# Patient Record
Sex: Female | Born: 1955 | Race: White | Hispanic: No | Marital: Married
Health system: Southern US, Community
[De-identification: ages and names within clinical notes are randomized; demographics above are authoritative.]

---

## 2011-02-02 ENCOUNTER — Inpatient Hospital Stay: Payer: Self-pay | Admitting: Internal Medicine

## 2011-03-30 ENCOUNTER — Emergency Department: Payer: Self-pay | Admitting: Unknown Physician Specialty

## 2011-03-31 LAB — BASIC METABOLIC PANEL
Calcium, Total: 8.5 mg/dL (ref 8.5–10.1)
Chloride: 103 mmol/L (ref 98–107)
Co2: 26 mmol/L (ref 21–32)
Creatinine: 0.63 mg/dL (ref 0.60–1.30)
Potassium: 3.9 mmol/L (ref 3.5–5.1)
Sodium: 142 mmol/L (ref 136–145)

## 2011-03-31 LAB — CBC
MCHC: 33.6 g/dL (ref 32.0–36.0)
RBC: 3.53 10*6/uL — ABNORMAL LOW (ref 3.80–5.20)
RDW: 14.7 % — ABNORMAL HIGH (ref 11.5–14.5)

## 2011-04-18 ENCOUNTER — Ambulatory Visit: Payer: Self-pay | Admitting: Unknown Physician Specialty

## 2011-09-24 ENCOUNTER — Observation Stay: Payer: Self-pay | Admitting: Internal Medicine

## 2011-09-24 LAB — CBC
MCH: 30.7 pg (ref 26.0–34.0)
MCHC: 32.5 g/dL (ref 32.0–36.0)
Platelet: 192 10*3/uL (ref 150–440)

## 2011-09-24 LAB — URINALYSIS, COMPLETE
Blood: NEGATIVE
Nitrite: POSITIVE
Ph: 7 (ref 4.5–8.0)
Protein: NEGATIVE
Specific Gravity: 1.008 (ref 1.003–1.030)
Squamous Epithelial: 1
WBC UR: 2 /HPF (ref 0–5)

## 2011-09-24 LAB — CK TOTAL AND CKMB (NOT AT ARMC)
CK, Total: 42 U/L (ref 21–215)
CK-MB: 0.6 ng/mL (ref 0.5–3.6)

## 2011-09-24 LAB — COMPREHENSIVE METABOLIC PANEL
Albumin: 3.7 g/dL (ref 3.4–5.0)
Alkaline Phosphatase: 142 U/L — ABNORMAL HIGH (ref 50–136)
BUN: 16 mg/dL (ref 7–18)
Bilirubin,Total: 0.3 mg/dL (ref 0.2–1.0)
Co2: 27 mmol/L (ref 21–32)
Creatinine: 0.82 mg/dL (ref 0.60–1.30)
EGFR (Non-African Amer.): >60
Glucose: 190 mg/dL — ABNORMAL HIGH (ref 65–99)
Osmolality: 271 (ref 275–301)
SGPT (ALT): 18 U/L
Sodium: 132 mmol/L — ABNORMAL LOW (ref 136–145)
Total Protein: 7 g/dL (ref 6.4–8.2)

## 2011-09-24 LAB — APTT
Activated PTT: 61.6 secs — ABNORMAL HIGH (ref 23.6–35.9)
Activated PTT: >160 secs (ref 23.6–35.9)

## 2011-09-24 LAB — PROTIME-INR: INR: 0.9

## 2011-09-25 LAB — CK TOTAL AND CKMB (NOT AT ARMC)
CK, Total: 42 U/L (ref 21–215)
CK-MB: 0.6 ng/mL (ref 0.5–3.6)

## 2011-09-25 LAB — APTT
Activated PTT: 62.1 secs — ABNORMAL HIGH (ref 23.6–35.9)
Activated PTT: 55 secs — ABNORMAL HIGH (ref 23.6–35.9)
Activated PTT: 62.5 secs — ABNORMAL HIGH (ref 23.6–35.9)

## 2011-09-25 LAB — BASIC METABOLIC PANEL
Anion Gap: 8 (ref 7–16)
BUN: 12 mg/dL (ref 7–18)
Calcium, Total: 8.8 mg/dL (ref 8.5–10.1)
Chloride: 94 mmol/L — ABNORMAL LOW (ref 98–107)
Glucose: 186 mg/dL — ABNORMAL HIGH (ref 65–99)
Osmolality: 271 (ref 275–301)

## 2011-09-25 LAB — CBC WITH DIFFERENTIAL/PLATELET
Basophil #: 0 10*3/uL (ref 0.0–0.1)
Eosinophil %: 2.9 %
HCT: 28.4 % — ABNORMAL LOW (ref 35.0–47.0)
HGB: 9.4 g/dL — ABNORMAL LOW (ref 12.0–16.0)
Lymphocyte #: 1.9 10*3/uL (ref 1.0–3.6)
MCH: 31.6 pg (ref 26.0–34.0)
MCV: 96 fL (ref 80–100)
Monocyte #: 0.5 x10 3/mm (ref 0.2–0.9)
Neutrophil #: 2.7 10*3/uL (ref 1.4–6.5)
Platelet: 165 10*3/uL (ref 150–440)
RBC: 2.97 10*6/uL — ABNORMAL LOW (ref 3.80–5.20)
RDW: 13.2 % (ref 11.5–14.5)

## 2011-09-25 LAB — LIPID PANEL
HDL Cholesterol: 36 mg/dL — ABNORMAL LOW (ref 40–60)
VLDL Cholesterol, Calc: 70 mg/dL — ABNORMAL HIGH (ref 5–40)

## 2011-09-25 LAB — TROPONIN I: Troponin-I: <0.02 ng/mL

## 2011-09-25 LAB — HEMOGLOBIN A1C: Hemoglobin A1C: 7.4 % — ABNORMAL HIGH (ref 4.2–6.3)

## 2011-09-26 LAB — APTT: Activated PTT: 88.9 secs — ABNORMAL HIGH (ref 23.6–35.9)

## 2011-09-27 LAB — BASIC METABOLIC PANEL WITH GFR
Anion Gap: 6 — ABNORMAL LOW
BUN: 9 mg/dL
Calcium, Total: 8.8 mg/dL
Chloride: 96 mmol/L — ABNORMAL LOW
Co2: 31 mmol/L
Creatinine: 0.69 mg/dL
EGFR (African American): >60
EGFR (Non-African Amer.): >60
Glucose: 191 mg/dL — ABNORMAL HIGH
Osmolality: 270
Potassium: 4.3 mmol/L
Sodium: 133 mmol/L — ABNORMAL LOW

## 2011-09-27 LAB — APTT: Activated PTT: 87.3 secs — ABNORMAL HIGH (ref 23.6–35.9)

## 2013-09-07 IMAGING — CR PELVIS - 1-2 VIEW
1 series · 2 of 2 positions shown · non-contrast
Comparison: none

REASON FOR EXAM: fall pain
COMMENTS:

PROCEDURE:     DXR - DXR PELVIS AP ONLY  - March 31, 2011  [DATE]
RESULT:     There is no evidence of fracture, dislocation, or malalignment.

[Series 1: t pelvis ap · 0.14mm/px · 2 of 2 slices shown]
[im 1/2]
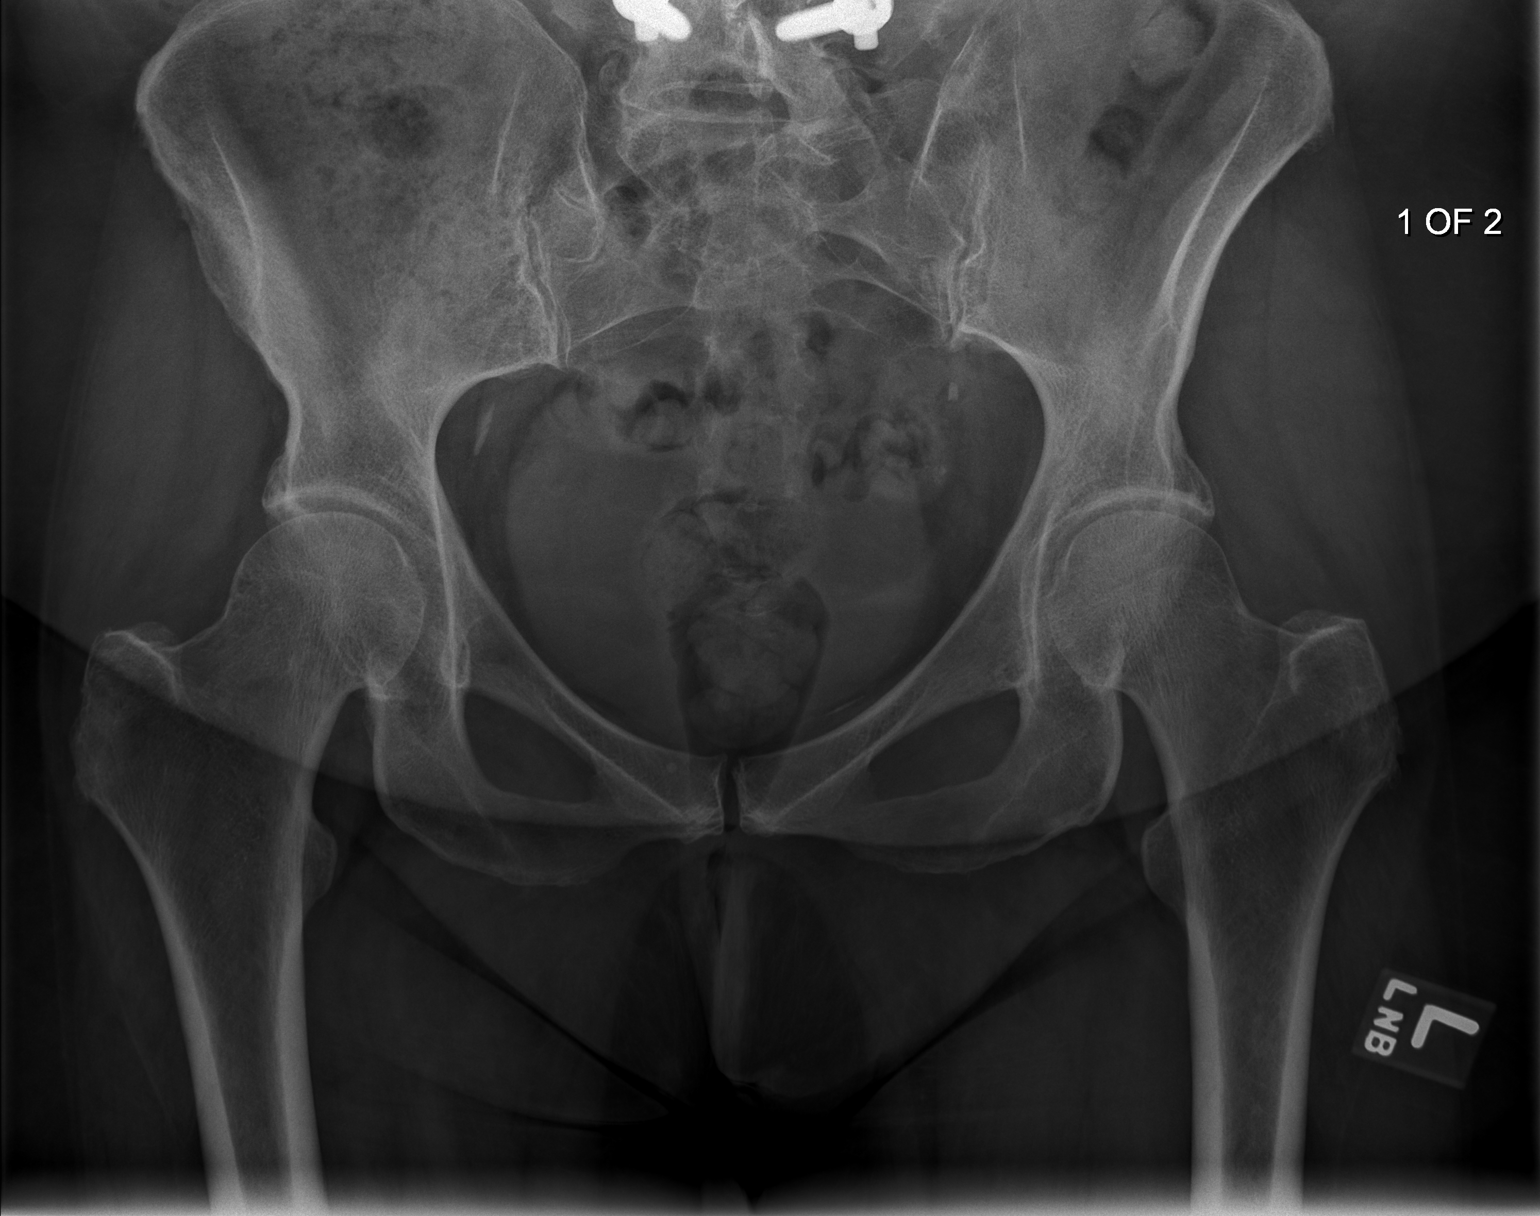
[im 2/2]
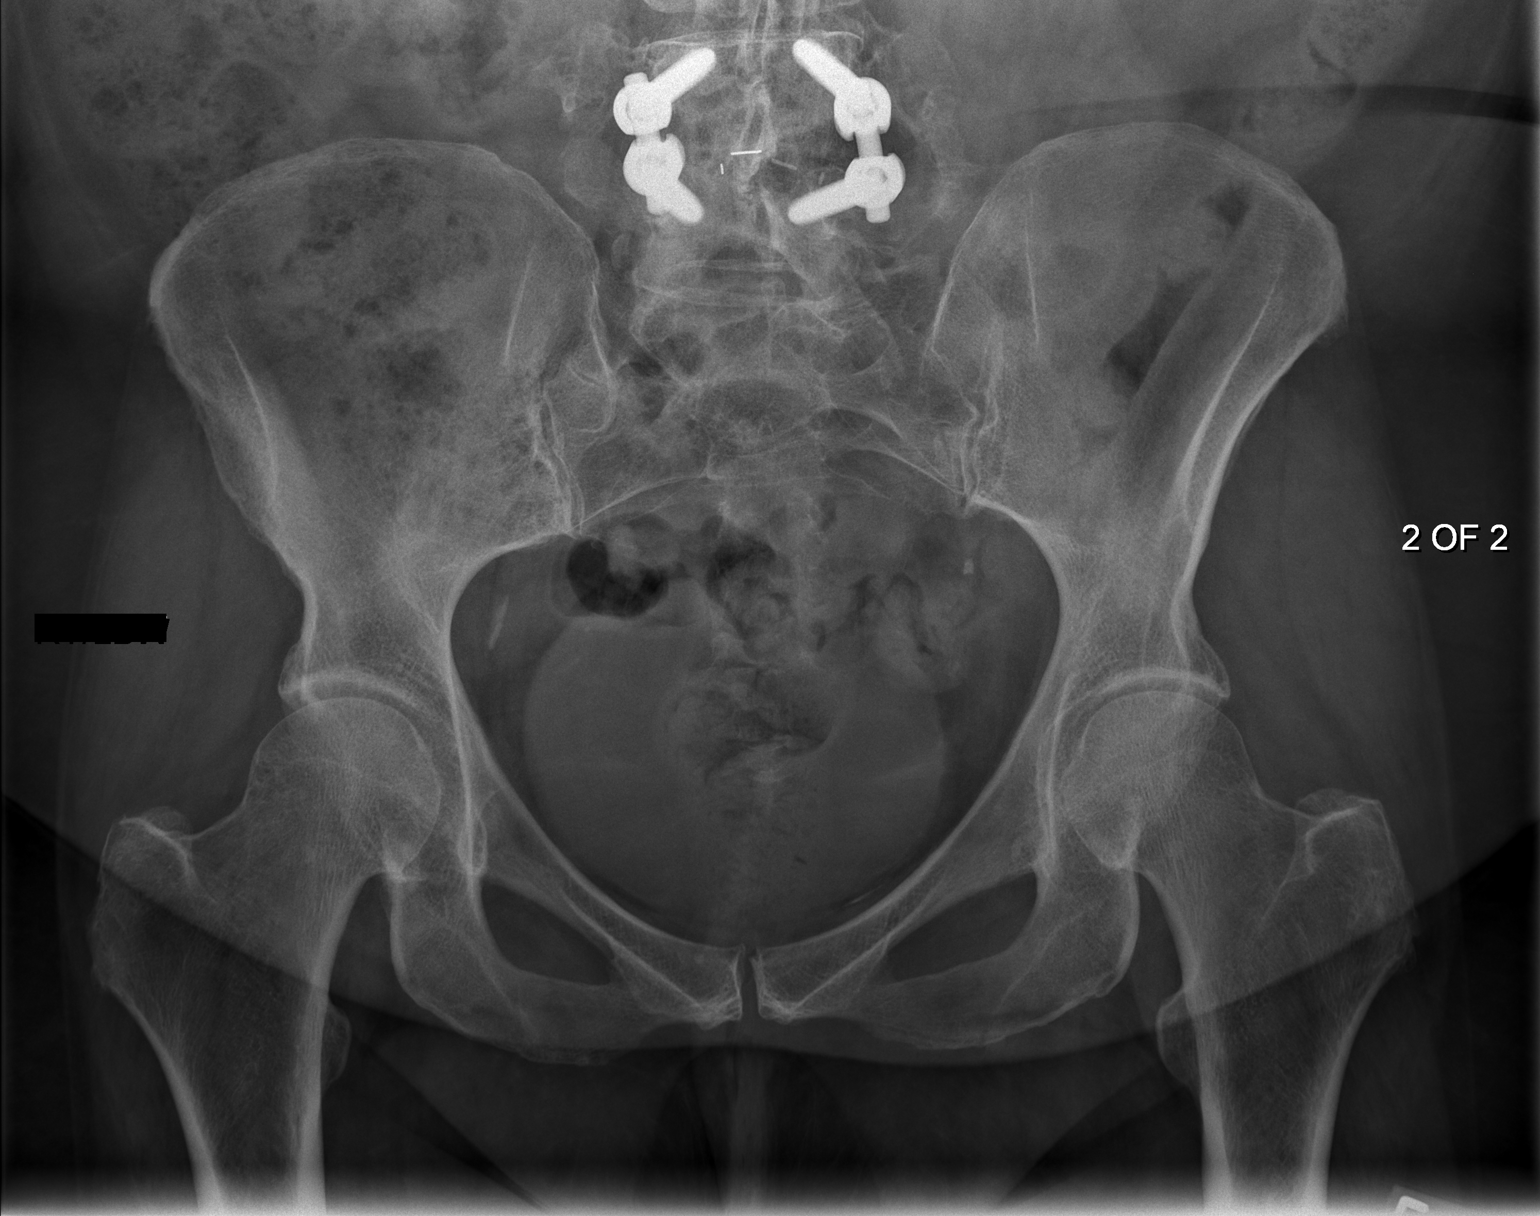

[2 of 2 positions shown; findings below may reference images not displayed]

IMPRESSION: 1. No evidence of acute abnormalities.
2. If there are persistent complaints of pain or persistent clinical
concern, a repeat evaluation in 7-10 days is recommended if clinically
warranted.

## 2013-09-07 IMAGING — CR DG CHEST 1V
1 series · 1 of 1 positions shown · non-contrast
Comparison: none

REASON FOR EXAM: cp
COMMENTS:

PROCEDURE:     DXR - DXR CHEST 1 VIEWAP OR PA  - March 31, 2011  [DATE]
RESULT:     The lungs are clear. The cardiac silhouette and visualized bony
skeleton are unremarkable. A right-sided Port-A-Cath is appreciated with tip
projecting in the region of vena cava.

[t chest supine]
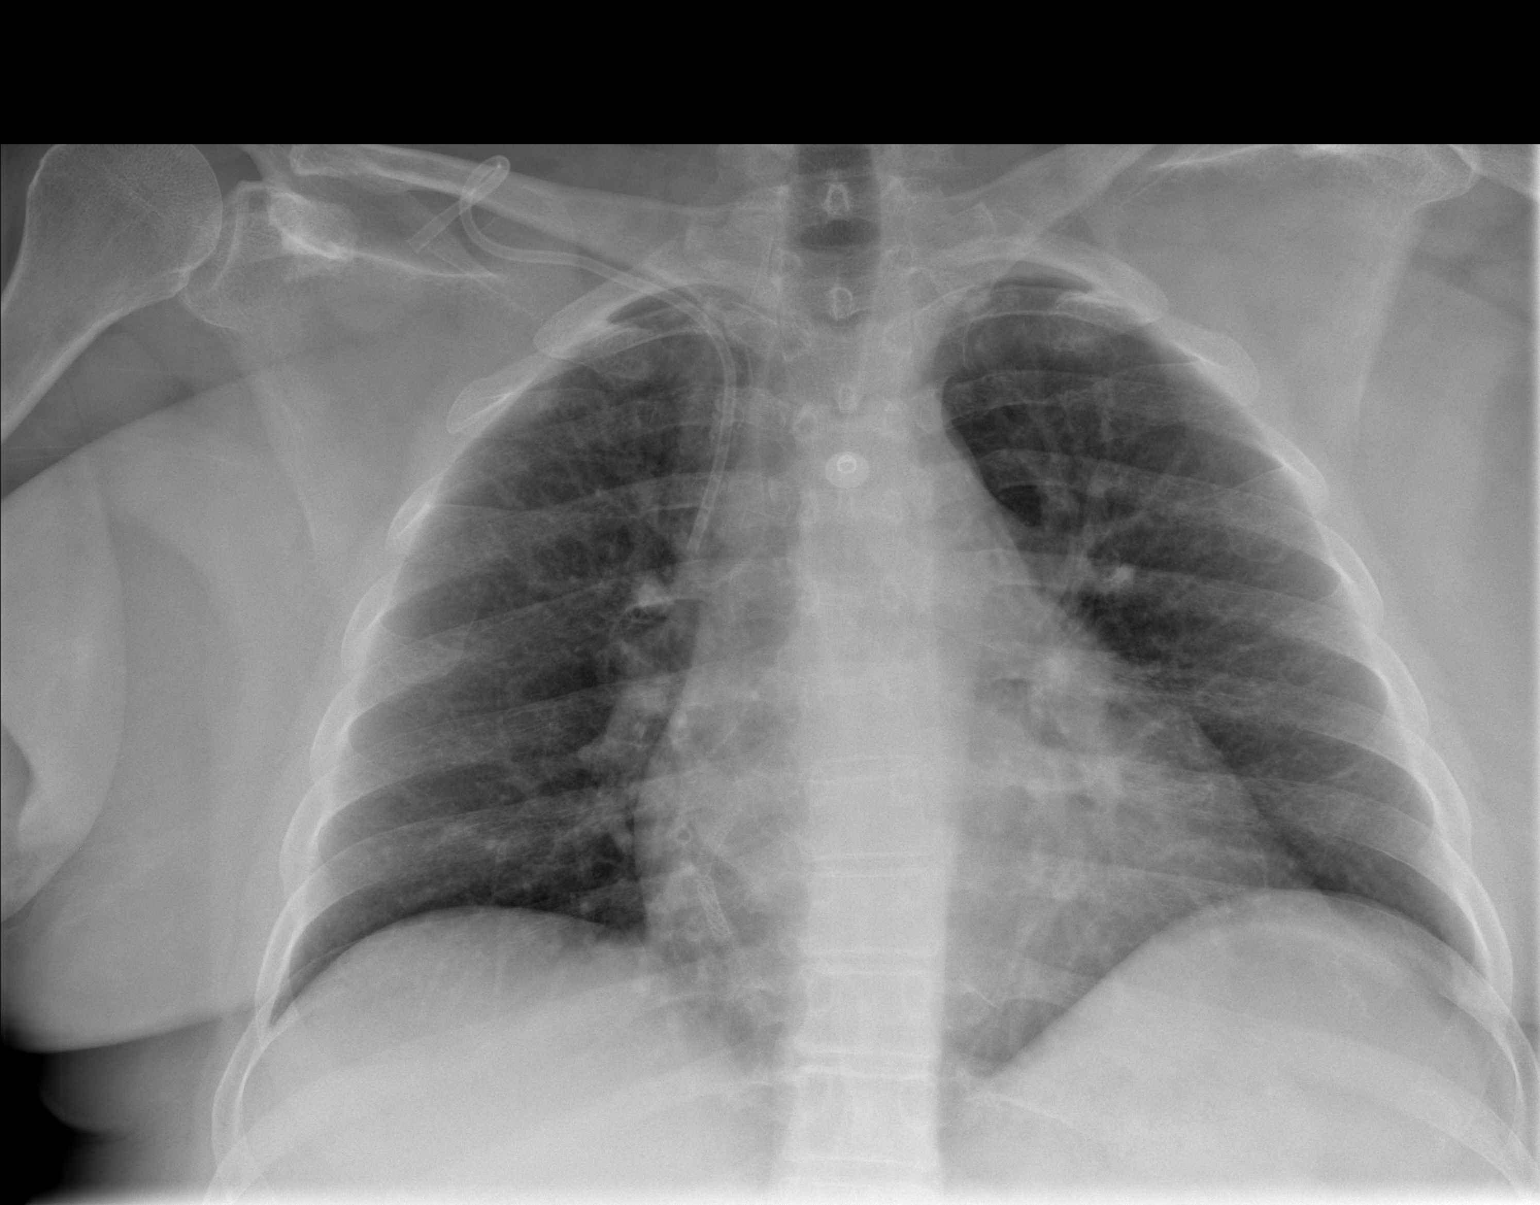

[1 of 1 positions shown; findings below may reference images not displayed]

IMPRESSION: 1. Chest radiograph without evidence of acute cardiopulmonary disease.
2. A comparison was made to prior study dated 02/02/2011.

## 2014-06-21 NOTE — Discharge Summary (Signed)
PATIENT NAME:  Kristen Newton, Ezella MR#:  [id number] DATE OF BIRTH:  May 07, 1955  DATE OF ADMISSION:  09/24/2011 DATE OF DISCHARGE:  09/27/2011  ADMISSION DIAGNOSIS: Chest pain.   DISCHARGE DIAGNOSES:  1. Chest pain, noncardiac in origin, status post computerized tomography per pulmonary embolus protocol which was negative for pulmonary embolus. No evidence of dissection or any other abnormality, possible musculoskeletal or gastrointestinal-related.  2. History of coronary artery disease, status post stenting. A repeat catheterization during this hospitalization shows patent stents.  3. Diabetes.  4. Hemochromatosis, has chronic med port in place.  5. Hyperlipidemia.  6. Hypertension.  7. Hypothyroidism.  8. Possible chronic obstructive pulmonary disease versus possible asthma history.  9. Fibromyalgia.  10. Anxiety/depression.  11. History of atrial fibrillation per report.  12. History of cerebrovascular accident with mild right-sided weakness in 2006.   PERTINENT LABORATORY, DIAGNOSTIC AND RADIOLOGICAL DATA: Sodium was 132, chloride was 97, potassium 3.7, creatinine 0.82. LFTs showed alkaline phosphatase 142, AST 12, ALT 18. Troponin <<MISSING TEXT>>  DICTATION ENDED.   ____________________________ Lacie ScottsShreyang H. Allena KatzPatel, MD shp:cbb D: 09/27/2011 16:13:12 ET T: 09/28/2011 10:51:00 ET JOB#: [id number]  cc: Leilynn Pilat H. Allena KatzPatel, MD, <Dictator>

## 2014-06-21 NOTE — Consult Note (Signed)
General Aspect 59 year old female with history of coronary disease status post PCI who presented with chief complaint of midsternal chest pain, weakness, fatigue, shortness of breath.  Patient has undergone a cardiac catheterization as recently as 2012 revealing patent RCA stent with insignificant disease in her left system.  She was treated with medications.  She states she has missed a number of her medications.  Her chest pain today is somewhat atypical.  It has also typical features with midsternal and radiation to her arm and back.   Pain has been fairly constant for the last 6-8 hours.  Her electrocardiogram is unchanged from her baseline and her cardiac markers are normal. she continues to smoke cigarettes intermittently.   Physical Exam:   GEN disheveled    HEENT PERRL, hearing intact to voice    NECK supple    RESP normal resp effort  rhonchi    CARD Regular rate and rhythm  No murmur    ABD denies tenderness  normal BS  no Adominal Mass    LYMPH negative neck, negative axillae    EXTR negative cyanosis/clubbing, negative edema    SKIN normal to palpation    NEURO cranial nerves intact, motor/sensory function intact    PSYCH A+O to time, place, person   Review of Systems:   Subjective/Chief Complaint chest pain, shortness of breath, weakness, fatigue as well as nausea and vomiting.    General: Fatigue  Weakness  Trouble sleeping    Skin: No Complaints    ENT: No Complaints    Eyes: No Complaints    Neck: No Complaints    Respiratory: Frequent cough  Short of breath    Cardiovascular: Chest pain or discomfort  Palpitations    Gastrointestinal: Nausea    Genitourinary: No Complaints    Vascular: No Complaints    Musculoskeletal: No Complaints    Neurologic: No Complaints    Hematologic: No Complaints    Endocrine: No Complaints    Psychiatric: No Complaints    Review of Systems: All other systems were reviewed and found to be negative     Medications/Allergies Reviewed Medications/Allergies reviewed     stroke:    CAD:    Diabetes:    Hemochromotosis:    Hypercholesterolemia:    Hypertension:    Hypothyroidism:    MI:   Home Medications: Medication Instructions Status  hydrochlorothiazide 25 mg oral tablet 1 tab(s) orally once a day Active  metformin 500 mg oral tablet 1 tab(s) orally 2 times a day Active  metoprolol tartrate 50 mg oral tablet 0.5 tab(s) orally once a day (in the morning) and 1 tablet orally once a day (in the evening) Active  isosorbide mononitrate 60 mg oral tablet, extended release 1 tab(s) orally 2 times a day Active  simvastatin 80 mg oral tablet 1 tab(s) orally once a day (at bedtime) Active  PriLOSEC OTC 20 mg oral delayed release tablet 1 tab(s) orally 1 to 2 times a day Active  Percocet 10/325 oral tablet 1 tab(s) orally 4 times a day Active  Nitrostat 0.4 mg sublingual tablet 1 tab(s) sublingual every 5 minutes, As Needed- for Chest Pain  Active  Plavix 75 mg oral tablet 1 tab(s) orally once a day Active  Wellbutrin 100 mg oral tablet 1 tab(s) orally 3 times a day Active  gabapentin 300 mg oral capsule 1 cap(s) orally 4 times a day Active  Butalbital Compound oral tablet 2 tab(s) orally every 6 hours, As Needed- for Headache  Active  promethazine 25 mg oral tablet 1 tab(s) orally once a day, As Needed- for Nausea, Vomiting  Active  Bayer Aspirin Regimen 81 mg oral delayed release tablet 1-2 tab(s) orally once a day Active  levothyroxine 50 mcg (0.05 mg) oral tablet 1 tab(s) orally once a day Active  nortriptyline 10 mg oral capsule 2 cap(s) orally once a day (at bedtime) Active  Soma 350 mg oral tablet 1 tab(s) orally 4 times a day Active  clonazepam 0.5 mg oral tablet 1 tab(s) orally 3 times a day Active  glyBURIDE 5 mg oral tablet 1 tab(s) orally 2 times a day Active  losartan 50 mg oral tablet 1 tab(s) orally once a day Active  meloxicam 15 mg oral tablet 1 tab(s) orally once a  day Active  citalopram 20 mg oral tablet 1 tab(s) orally once a day Active   EKG:   EKG NSR    Abnormal NSSTTW changes    Penicillin: Anaphylaxis    Impression 59 year old female history of coronary artery disease status post PCI with cardiac catheterization in December of last year revealing widely patent stent with insignificant disease in the left system.  She now presents with multiple symptoms of shortness of breath, chest pain, palpitations, nausea, vomiting.  Her electrocardiogram is unchanged from her baseline and her cardiac markers are normal.  Etiology of the symptoms are unclear.  Given her recent cardiac catheterization with insignificant disease progression, this does not appear to be in acute coronary event.  Her cardiac markers are negative as far.  She has been noncompliant with a number of her medications and she is not sure which she is been taking and which she has been missing.  Given her lack of electrocardiographic changes and negative cardiac markers, do not think it would be prudent to proceed with left cardiac catheterization to followup per anatomy at this time.    Plan 1.  Resume home medications 2.  Continue to rule out for myocardial infarction.  If she rules out for myocardial infarction, would ambulating consider functional study to assess for progression of disease in the morning.  Should she rule in for a myocardial infarction, would consider left cardiac catheterization at that time or should there be an abnormal functional study 3.  Refrain from tobacco use 4.  Treat UTI 5.  Low-fat, low-cholesterol, low-sodium diet 6.  Further recommendations as her cardiac markers are returned.   Electronic Signatures: Dalia Heading (MD)  (Signed 23-Jul-13 15:05)  Authored: General Aspect/Present Illness, History and Physical Exam, Review of System, Past Medical History, Home Medications, EKG , Allergies, Impression/Plan   Last Updated: 23-Jul-13 15:05 by Dalia Heading (MD)

## 2014-06-21 NOTE — Discharge Summary (Signed)
PATIENT NAME:  Kristen Newton, Kristen Newton MR#:  [id number] DATE OF BIRTH:  October 31, 1955  DATE OF ADMISSION:  09/24/2011 DATE OF DISCHARGE:  09/27/2011  ADMISSION DIAGNOSIS: Chest pain.   DISCHARGE DIAGNOSES:  1. Chest pain, noncardiac in origin, status post cardiac catheterization.  Computerized tomography per pulmonary embolus protocol was negative, likely musculoskeletal or gastrointestinal related.  2. Coronary artery disease, status post stenting x3.  3. Diabetes.  4. Hemochromatosis, has a Mediport in place.  5. Hyperlipidemia.  6. Hypertension.  7. Hypothyroidism.  8. Possible chronic obstructive pulmonary disease.  9. Fibromyalgia.  10. Depression/anxiety.  11. History of atrial fibrillation per report.  12. History of cerebrovascular accident with mild right-sided weakness.   CONSULTANT: Dr. Lady GaryFath.  PERTINENT LABORATORY, DIAGNOSTIC AND RADIOLOGICAL DATA: Sodium 132, chloride 97, potassium 3.7, creatinine 0.82. LFTs showed an alkaline phosphatase of 142, AST 12, ALT 18. Troponin negative x1. WBC 6, hemoglobin 10.3, platelet count was 192, INR 0.9. EKG showed sinus rhythm with first-degree AV block. There are septal infarct Q waves in V1, V2, V3 without any acute ST changes. CT per PE protocol shows no evidence of PE, no acute thoracic aortic pathology, no evidence of congestive heart failure or pneumonia. Increase in interstitial markings presents nonspecific.  There is no bulky lymphadenopathy. Cardiac catheterization showed patent stents with mid RCA 10% tubular stenosis. Otherwise, no significant abnormality.   HOSPITAL COURSE: Please refer to History and Physical done by the admitting physician. The patient is a 59 year old Caucasian female with multiple medical problems who has had previous three stents in the past with fibromyalgia, who presented with chest pain that woke her up. The patient apparently lives in [place] and was visiting her son. She presented to our hospital with these  complaints. The patient was admitted and underwent evaluation. The patient had presented with similar presentation in December 2012. She underwent a cardiac catheterization for her chest pain which was concerning for unstable angina. Cardiac catheterization showed no restenosis or occlusion of her stents. Her stents were patent. She also had a CT per PE protocol which was negative. At this time, her pain is felt to be either musculoskeletal or GI-related. She is currently stable for discharge.   DISCHARGE MEDICATIONS:  1. Levothyroxine 50 mcg, 1 tab p.o. daily.  2. Nortriptyline 20 mg at bedtime.  3. Soma 350, 1 tab q.i.d.  4. Clonazepam 0.5, 1 tab p.o. t.i.d.  5. Glyburide 5 mg b.i.d.  6. Losartan 50 mg daily.  7. Meloxicam 15 mg daily.  8. Citalopram 20 mg daily.  9. Hydrochlorothiazide 25 mg daily.  10. Metformin 500 mg p.o. b.i.d.  11. Metoprolol tartrate 25 mg daily.  12. Isosorbide mononitrate 1 tab p.o. b.i.d.  13. Simvastatin 80 mg at bedtime.  14. Prilosec 20 mg daily.  15. Percocet 10/325 mg, 1 tab q.i.d.  16. Nitroglycerin sublingual p.r.n.  17. Plavix 75 mg p.o. daily.  18. Wellbutrin 100 mg, 1 tab p.o. t.i.d.  19. Gabapentin 300 mg, 1 tab q.i.d.  20. Butalbital 2 tabs every 6 hours.  21. Promethazine 25 mg once daily as needed.  22. Baby aspirin 81 mg 1 tab p.o. daily.   ACTIVITY: As tolerated.   DIET: Low sodium.   TIMEFRAME FOR FOLLOW-UP: 2 to 4 weeks with primary physician in [place].   TIME SPENT: 35 minutes. ____________________________ Lacie ScottsShreyang H. Allena KatzPatel, MD shp:cbb D: 09/27/2011 16:30:02 ET T: 09/28/2011 10:55:39 ET JOB#: [id number]  cc: Felicity Penix H. Allena KatzPatel, MD, <Dictator> Charise CarwinSHREYANG H Alyce Inscore MD ELECTRONICALLY SIGNED  10/01/2011 12:16 

## 2014-06-21 NOTE — H&P (Signed)
PATIENT NAME:  Kristen Newton, Kristen Newton MR#:  [id number] DATE OF BIRTH:  Jun 03, 1955  DATE OF ADMISSION:  09/24/2011  REFERRING PHYSICIAN: Dr. Mayford KnifeWilliams    PRIMARY CARE PHYSICIAN: In [place]    CHIEF COMPLAINT: Chest pain.   HISTORY OF PRESENT ILLNESS: The patient is 59 year old obese Caucasian female with history of hypertension, diabetes, coronary artery disease status post three stents in the past, and fibromyalgia who presents with chest pain. The chest pain woke her up from sleep about 5 a.m. and she took nitroglycerin x3 with some improvement. She also took out two baby aspirins. Pain was substernal with radiation somewhat to the back. She had some nausea. She stated that she had a severe pain in the chest likely worse than ever before. Her pressure was also elevated when she checked and she called EMS. With the EMS she was given morphine and two baby aspirins as well. Here she was evaluated in the ED. She has had a negative troponin and an EKG which shows very similar to prior. However, given the typical story she was started on heparin drip as well as nitro drip. The patient stated that the chest pain currently is much better but comes off and on. Hospitalist service was contacted for further evaluation and management.   PAST MEDICAL HISTORY:  1. Coronary artery disease status post stenting x3.  2. Diabetes. 3. Hemochromatosis and has Mediport in place. 4. Hyperlipidemia. 5. Hypertension. 6. Hypothyroidism. 7. Possible chronic obstructive pulmonary disease versus possible asthma history.  8. Fibromyalgia.  9. Depression/anxiety. 10. History of atrial fibrillation per report. 11. History of CVA with mild right-sided weakness in 2006.   CURRENT MEDICATIONS:  1. Aspirin 81 mg 1 to 2 tabs daily.  2. Butalbital compound oral tablet 2 tabs every six hours as needed for headache. 3. Citalopram 20 mg daily.  4. Clonazepam 0.5 mg 3 times a day.  5. Gabapentin 300 mg 4 times a day.  6. Glyburide  5 mg 2 times a day.  7. Hydrochlorothiazide 25 mg once a day  8. Isosorbide mononitrate 60 mg 2 times a day.  9. Levothyroxine 50 mcg once a day.  10. Losartan 50 mg daily.  11. Meloxicam 15 mg daily. 12. Metformin 500 mg 2 times a day.  13. Metoprolol tartrate 25 mg 2 times a day.  14. Nitrostat 0.4 mg sublingual every five minutes as needed for chest pain.  15. Nortriptyline 10 mg 2 caps once a day. 16. Percocet 10/325 1 tab 4 times a day. 17. Plavix 75 mg daily.  18. Prilosec OTC 20 mg 1 to 2 times a day.  19. Promethazine 25 mg p.r.n. for nausea.  20. Simvastatin 80 mg daily. 21. Soma 350 mg 4 times a day. 22. Wellbutrin 100 mg 3 times a day.   ALLERGIES: Penicillin.   SOCIAL HISTORY: History of prior tobacco abuse, quit years ago. No alcohol or drug use.   FAMILY HISTORY: Positive for hemochromatosis, coronary artery disease, MI, diabetes, and brother with lung cancer.   PAST SURGICAL HISTORY:  1. Hysterectomy.  2. Back surgery. 3. Cholecystectomy.  4. Shoulder surgery.  5. Wrist surgery. 6. Port-A-Cath on the right side.   REVIEW OF SYSTEMS: CONSTITUTIONAL: No fever or fatigue. The patient does have positive weight loss of about 40 pounds in the last year which is unintentional. EYES: Has chronic blurry vision and diabetic neuropathy. ENT: No hearing loss or sinus problems. RESPIRATORY: No cough. Had some wheezing earlier today. No hemoptysis. Possible COPD  versus asthma. CARDIOVASCULAR: Positive for chest pain. No palpitations, edema in the lower extremities, or dyspnea on exertion. GI: Had episode of nausea and vomiting earlier in the morning. No abdominal pain, melena, or, hematemesis. GU: No dysuria or hematuria. ENDOCRINE: No polyuria or polydipsia. HEME/LYMPH: History of hemochromatosis. SKIN: No new rashes. MUSCULOSKELETAL: Has chronic arthritis. NEUROLOGICAL: History of CVA. PSYCHIATRIC: History of anxiety and depression.   PHYSICAL EXAMINATION:   VITAL SIGNS: On  arrival temperature 96.7, pulse 75, respiratory rate 24, blood pressure 124/63, oxygen sat 100% on room air.   GENERAL: The patient is a morbidly obese elderly female who appears older than stated age sitting in bed in no obvious distress talking in full sentences.   HEENT: Normocephalic, atraumatic. Pupils are equal and reactive. Anicteric sclerae. Moist mucous membranes   NECK: Supple. No thyroid tenderness.   CARDIOVASCULAR: S1, S2 regular rate and rhythm. No murmurs, rubs, or gallops.   LUNGS: Mild bilateral wheezing. Good air entry.   ABDOMEN: Soft, nontender, nondistended. Positive bowel sounds in all quadrants. No organomegaly appreciated.   EXTREMITIES: No significant lower extremity edema.   SKIN: No obvious lesions or rashes. She does have a Port-A-Cath on the right upper chest.  NEUROLOGIC: The patient does have some decrease in her nasolabial folds. Strength is 5/5 in all extremities. Sensation is intact to light touch.   PSYCH: Pleasant, awake, alert, oriented x3, conversant, cooperative.   LABORATORY, DIAGNOSTIC, AND RADIOLOGICAL DATA: Sodium 132, chloride 97, potassium 3.7, creatinine 0.82. LFTs alkaline phosphatase 142, AST 12, ALT 18. Troponin negative x1. WBC 6, hemoglobin 10.3, hematocrit 31.9, platelets 192,000. INR 0.9.   EKG sinus rhythm with first degree AV block. There is septal infarct, Q waves in V1, V2, V3, otherwise looks pretty much like prior EKG without any acute ST elevations or depressions.   ASSESSMENT AND PLAN: We have a 59 year old Caucasian female with history of coronary artery disease with stenting in the past, diabetes, hemochromatosis, and hyperlipidemia who presents with chest pain. The patient has multiple typical features including multiple risk factors including known coronary artery disease. However, she has had a cath within the past year which showed mild coronary artery disease. At this point her chest pain is better. She has negative  troponin and no acute ST elevations or depressions on the EKG. At this point we will admit the patient for observation and start the patient on aspirin, beta-blocker, statin, and resume her Plavix. She has been started on heparin drip which we will continue. However, I would consider stopping the nitroglycerin drip. Will put in a Cardiology consult and get an echocardiogram. We will trend troponins as well as check a lipid panel in the morning. I have discussed the case with Dr. Lady Gary who will come see the patient. The patient needs closer monitoring as an outpatient with her cardiologist.   In regards to her diabetes, I would hold her oral agents and start her on sliding scale insulin.   In regards to her hyperlipidemia, I would continue the statin she is on.   I would continue her pain regimen for her fibromyalgia.   Would resume her Synthroid as well.   She does have history of atrial fibrillation, however, was on Coumadin prior but not any longer. The patient currently is in sinus and in rate control.   CODE STATUS: The patient is a FULL CODE.   TOTAL TIME SPENT: 60 minutes.  ____________________________ Krystal Eaton, MD sa:drc D: 09/24/2011 10:11:51 ET T: 09/24/2011 10:30:08 ET  JOB#: [id number] cc: Krystal Eaton, MD, <Dictator> Krystal Eaton MD ELECTRONICALLY SIGNED 09/27/2011 19:12
